# Patient Record
Sex: Female | Born: 2000 | Race: Black or African American | Hispanic: No | Marital: Single | Smoking: Current every day smoker
Health system: Southern US, Community
[De-identification: ages and names within clinical notes are randomized; demographics above are authoritative.]

---
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
## 2021-09-14 ENCOUNTER — Encounter: Payer: Self-pay | Admitting: Emergency Medicine

## 2021-09-14 ENCOUNTER — Emergency Department
Admission: EM | Admit: 2021-09-14 | Discharge: 2021-09-14 | Disposition: A | Discharge status: Home / Self Care | Payer: Medicaid Other | Attending: Emergency Medicine | Admitting: Emergency Medicine

## 2021-09-14 ENCOUNTER — Other Ambulatory Visit: Payer: Self-pay

## 2021-09-14 DIAGNOSIS — E860 Dehydration: Secondary | ICD-10-CM | POA: Diagnosis not present

## 2021-09-14 DIAGNOSIS — R112 Nausea with vomiting, unspecified: Secondary | ICD-10-CM

## 2021-09-14 DIAGNOSIS — R531 Weakness: Secondary | ICD-10-CM | POA: Diagnosis present

## 2021-09-14 DIAGNOSIS — Z20822 Contact with and (suspected) exposure to covid-19: Secondary | ICD-10-CM | POA: Insufficient documentation

## 2021-09-14 LAB — URINALYSIS, ROUTINE W REFLEX MICROSCOPIC
Bacteria, UA: NONE SEEN
Bilirubin Urine: NEGATIVE
Glucose, UA: NEGATIVE mg/dL
Hgb urine dipstick: NEGATIVE
Ketones, ur: NEGATIVE mg/dL
Nitrite: NEGATIVE
Protein, ur: NEGATIVE mg/dL
Specific Gravity, Urine: 1.019 (ref 1.005–1.030)
pH: 5 (ref 5.0–8.0)

## 2021-09-14 LAB — CBC
HCT: 41.2 % (ref 36.0–46.0)
Hemoglobin: 13.3 g/dL (ref 12.0–15.0)
MCH: 29.6 pg (ref 26.0–34.0)
MCHC: 32.3 g/dL (ref 30.0–36.0)
MCV: 91.6 fL (ref 80.0–100.0)
Platelets: 261 10*3/uL (ref 150–400)
RBC: 4.5 MIL/uL (ref 3.87–5.11)
RDW: 12.2 % (ref 11.5–15.5)
WBC: 5 10*3/uL (ref 4.0–10.5)
nRBC: 0 % (ref 0.0–0.2)

## 2021-09-14 LAB — BASIC METABOLIC PANEL
Anion gap: 5 (ref 5–15)
BUN: 9 mg/dL (ref 6–20)
CO2: 24 mmol/L (ref 22–32)
Calcium: 9 mg/dL (ref 8.9–10.3)
Chloride: 109 mmol/L (ref 98–111)
Creatinine, Ser: 0.77 mg/dL (ref 0.44–1.00)
GFR, Estimated: >60 mL/min (ref >60)
Glucose, Bld: 111 mg/dL — ABNORMAL HIGH (ref 70–99)
Potassium: 3.9 mmol/L (ref 3.5–5.1)
Sodium: 138 mmol/L (ref 135–145)

## 2021-09-14 LAB — POC URINE PREG, ED: Preg Test, Ur: NEGATIVE

## 2021-09-14 LAB — SARS CORONAVIRUS 2 BY RT PCR: SARS Coronavirus 2 by RT PCR: NEGATIVE

## 2021-09-14 MED ORDER — SODIUM CHLORIDE 0.9 % IV BOLUS
1000.0000 mL | Freq: Once | INTRAVENOUS | Status: AC
Start: 2021-09-14 — End: 2021-09-14
  Administered 2021-09-14: 1000 mL via INTRAVENOUS

## 2021-09-14 MED ORDER — METOCLOPRAMIDE HCL 5 MG/ML IJ SOLN
10.0000 mg | Freq: Once | INTRAMUSCULAR | Status: AC
  Administered 2021-09-14: 10 mg via INTRAVENOUS
  Filled 2021-09-14: qty 2

## 2021-09-14 MED ORDER — ONDANSETRON 8 MG PO TBDP: 8.0000 mg | ORAL_TABLET | Freq: Three times a day (TID) | ORAL | 0 refills | Status: AC | PRN

## 2021-09-14 NOTE — ED Triage Notes (Signed)
Patient to ED via POV ambulatory to triage for generalized weakness x1 week. Patient states she had one episode of vomiting this AM and has been feeling shaky with a headache since.

## 2021-09-14 NOTE — ED Provider Notes (Signed)
[facility] Provider Note   Event Date/Time   First MD Initiated Contact with Patient 09/14/21 [phone]     (approximate) History  Weakness  HPI Sandra Huerta is a 21 y.o. female  Location: Generalized Duration: 1 week prior to arrival Timing: Worsening since onset Severity: Moderate Quality: Weakness Context: Patient states that she has had worsening generalized weakness over the last week as well's nausea/vomiting beginning this morning Modifying factors: Any p.o. intake worsens her nausea vomiting she denies any relieving factors Associated Symptoms: Nausea/vomiting, headache ROS: Patient currently denies any vision changes, tinnitus, difficulty speaking, facial droop, sore throat, chest pain, shortness of breath, abdominal pain, diarrhea, dysuria, or weakness/numbness/paresthesias in any extremity   Physical Exam  Triage Vital Signs: ED Triage Vitals  Enc Vitals Group     BP 09/14/21 0754 (!) 133/92     Pulse Rate 09/14/21 0754 (!) 101     Resp 09/14/21 0754 18     Temp 09/14/21 0754 98.4 F (36.9 C)     Temp Source 09/14/21 0754 Oral     SpO2 09/14/21 0754 100 %     Weight 09/14/21 0755 110 lb (49.9 kg)     Height 09/14/21 0755 5\' 9"  (1.753 m)     Head Circumference --      Peak Flow --      Pain Score 09/14/21 0755 7     Pain Loc --      Pain Edu? --      Excl. in GC? --    Most recent vital signs: Vitals:   09/14/21 0754  BP: (!) 133/92  Pulse: (!) 101  Resp: 18  Temp: 98.4 F (36.9 C)  SpO2: 100%   General: Awake, oriented x4. CV:  Good peripheral perfusion.  Resp:  Normal effort.  Abd:  No distention.  Other:  Young adult African-American female laying in bed in no acute distress ED Results / Procedures / Treatments  Labs (all labs ordered are listed, but only abnormal results are displayed) Labs Reviewed  BASIC METABOLIC PANEL - Abnormal; Notable for the following components:      Result Value   Glucose, Bld 111 (*)     All other components within normal limits  URINALYSIS, ROUTINE W REFLEX MICROSCOPIC - Abnormal; Notable for the following components:   Color, Urine YELLOW (*)    APPearance CLOUDY (*)    Leukocytes,Ua LARGE (*)    All other components within normal limits  SARS CORONAVIRUS 2 BY RT PCR  CBC  POC URINE PREG, ED  CBG MONITORING, ED  PROCEDURES: Critical Care performed: No Procedures MEDICATIONS ORDERED IN ED: Medications  metoCLOPramide (REGLAN) injection 10 mg (10 mg Intravenous Given 09/14/21 0917)  sodium chloride 0.9 % bolus 1,000 mL (0 mLs Intravenous Stopped 09/14/21 1014)   IMPRESSION / MDM / ASSESSMENT AND PLAN / ED COURSE  I reviewed the triage vital signs and the nursing notes.                             The patient is on the cardiac monitor to evaluate for evidence of arrhythmia and/or significant heart rate changes. Patient's presentation is most consistent with acute presentation with potential threat to life or bodily function. Patient presents for acute nausea/vomiting The cause of the patients symptoms is not clear, but the patient is overall well appearing and is suspected to have a transient course of illness.  Given  History and Exam there does not appear to be an emergent cause of the symptoms such as small bowel obstruction, coronary syndrome, bowel ischemia, DKA, pancreatitis, appendicitis, other acute abdomen or other emergent problem.  Reassessment: After treatment, the patient is feeling much better, tolerating PO fluids, and shows no signs of dehydration.   Disposition: Discharge home with prompt primary care physician follow up in the next 48 hours. Strict return precautions discussed.   FINAL CLINICAL IMPRESSION(S) / ED DIAGNOSES   Final diagnoses:  Dehydration  Nausea and vomiting, unspecified vomiting type  Generalized weakness   Rx / DC Orders   ED Discharge Orders          Ordered    ondansetron (ZOFRAN-ODT) 8 MG disintegrating tablet  Every 8  hours PRN        09/14/21 1008           Note:  This document was prepared using Dragon voice recognition software and may include unintentional dictation errors.   Merwyn Katos, MD 09/14/21 1015

## 2021-09-14 NOTE — ED Notes (Signed)
See triage note  Presents with headache  Not feeling herself  States she also has some vomiting   unsure of fever but has "felt hot"

## 2021-09-24 ENCOUNTER — Emergency Department
Admission: EM | Admit: 2021-09-24 | Discharge: 2021-09-24 | Disposition: A | Discharge status: Home / Self Care | Payer: Medicaid Other | Attending: Emergency Medicine | Admitting: Emergency Medicine

## 2021-09-24 ENCOUNTER — Other Ambulatory Visit: Payer: Self-pay

## 2021-09-24 DIAGNOSIS — R293 Abnormal posture: Secondary | ICD-10-CM

## 2021-09-24 DIAGNOSIS — J069 Acute upper respiratory infection, unspecified: Secondary | ICD-10-CM | POA: Insufficient documentation

## 2021-09-24 DIAGNOSIS — L739 Follicular disorder, unspecified: Secondary | ICD-10-CM | POA: Diagnosis not present

## 2021-09-24 DIAGNOSIS — R059 Cough, unspecified: Secondary | ICD-10-CM | POA: Diagnosis present

## 2021-09-24 MED ORDER — AMOXICILLIN 500 MG PO CAPS: 500.0000 mg | ORAL_CAPSULE | Freq: Three times a day (TID) | ORAL | 0 refills | Status: DC

## 2021-09-24 MED ORDER — BENZONATATE 100 MG PO CAPS: 100.0000 mg | ORAL_CAPSULE | Freq: Three times a day (TID) | ORAL | 0 refills | Status: AC | PRN

## 2021-09-24 NOTE — ED Triage Notes (Signed)
Pt c/o cough with congestion, body aches and HA since Tuesday.Marland Kitchen

## 2021-09-24 NOTE — Discharge Instructions (Addendum)
Follow up with your regular doctor Follow up with dermatology Follow up with orthopedics The ER cannot provide family medicine, it is important that you have a regular doctor and follow up with them

## 2021-09-24 NOTE — ED Notes (Signed)
See triage note  Presents with runny nose   cough and body aches  States she was seen for same last week  States sx's became worse on Tuesday  unsure of fever  states she has been fatigued

## 2021-09-24 NOTE — ED Provider Notes (Signed)
[facility] Provider Note    Event Date/Time   First MD Initiated Contact with Patient 09/24/21 0720     (approximate)   History   URI   HPI  Sandra Huerta is a 21 y.o. female presents emergency department multiple complaints.  Patient states she has had a runny nose, cough and body aches for few days.  Came last week and had a negative COVID test.  No known fever.  Also has had a headache.  Concerned about her posture and would like to see someone about that.  Also is concerned because she keeps getting these Bruun bumps on her pubis that she pops and it goes away.      Physical Exam   Triage Vital Signs: ED Triage Vitals  Enc Vitals Group     BP 09/24/21 0718 132/88     Pulse Rate 09/24/21 0718 98     Resp 09/24/21 0718 20     Temp 09/24/21 0718 98.4 F (36.9 C)     Temp Source 09/24/21 0718 Oral     SpO2 09/24/21 0718 100 %     Weight 09/24/21 0717 109 lb 12.6 oz (49.8 kg)     Height 09/24/21 0717 5\' 9"  (1.753 m)     Head Circumference --      Peak Flow --      Pain Score --      Pain Loc --      Pain Edu? --      Excl. in GC? --     Most recent vital signs: Vitals:   09/24/21 0718  BP: 132/88  Pulse: 98  Resp: 20  Temp: 98.4 F (36.9 C)  SpO2: 100%     General: Awake, no distress.   CV:  Good peripheral perfusion. regular rate and  rhythm Resp:  Normal effort. Lungs CTA Abd:  No distention.   Other:      ED Results / Procedures / Treatments   Labs (all labs ordered are listed, but only abnormal results are displayed) Labs Reviewed - No data to display   EKG     RADIOLOGY     PROCEDURES:   Procedures   MEDICATIONS ORDERED IN ED: Medications - No data to display   IMPRESSION / MDM / ASSESSMENT AND PLAN / ED COURSE  I reviewed the triage vital signs and the nursing notes.                              Differential diagnosis includes, but is not limited to, COVID, URI, common cold,  folliculitis  Patient's presentation is most consistent with acute, uncomplicated illness.   Most of patient's complaints should be followed up with a family physician.  I did have a long discussion with her about this.  Explained to her the emergency department cannot provide family medicine which would include her following up with someone for her posture and folliculitis.  Due to the upper respiratory infection we can treat that with an antibiotic and some cough medicine.  She was discharged stable condition.      FINAL CLINICAL IMPRESSION(S) / ED DIAGNOSES   Final diagnoses:  Acute URI  Folliculitis  Bad posture     Rx / DC Orders   ED Discharge Orders          Ordered    amoxicillin (AMOXIL) 500 MG capsule  3 times daily,   Status:  Discontinued        09/24/21 0746    benzonatate (TESSALON PERLES) 100 MG capsule  3 times daily PRN        09/24/21 0746    amoxicillin (AMOXIL) 500 MG capsule  3 times daily        09/24/21 4656             Note:  This document was prepared using Dragon voice recognition software and may include unintentional dictation errors.    Faythe Ghee, PA-C 09/24/21 8127    Arnaldo Natal, MD 09/24/21 [phone]

## 2021-11-02 ENCOUNTER — Emergency Department: Payer: Medicaid Other

## 2021-11-02 ENCOUNTER — Emergency Department
Admission: EM | Admit: 2021-11-02 | Discharge: 2021-11-02 | Disposition: A | Discharge status: Home / Self Care | Payer: Medicaid Other | Attending: Emergency Medicine | Admitting: Emergency Medicine

## 2021-11-02 ENCOUNTER — Other Ambulatory Visit: Payer: Self-pay

## 2021-11-02 DIAGNOSIS — W540XXA Bitten by dog, initial encounter: Secondary | ICD-10-CM | POA: Diagnosis not present

## 2021-11-02 DIAGNOSIS — S51831A Puncture wound without foreign body of right forearm, initial encounter: Secondary | ICD-10-CM | POA: Insufficient documentation

## 2021-11-02 DIAGNOSIS — S51851A Open bite of right forearm, initial encounter: Secondary | ICD-10-CM

## 2021-11-02 DIAGNOSIS — T148XXA Other injury of unspecified body region, initial encounter: Secondary | ICD-10-CM

## 2021-11-02 DIAGNOSIS — S59911A Unspecified injury of right forearm, initial encounter: Secondary | ICD-10-CM | POA: Diagnosis present

## 2021-11-02 MED ORDER — AMOXICILLIN-POT CLAVULANATE 875-125 MG PO TABS: 1.0000 | ORAL_TABLET | Freq: Two times a day (BID) | ORAL | 0 refills | Status: AC

## 2021-11-02 MED ORDER — TETANUS-DIPHTH-ACELL PERTUSSIS 5-2.5-18.5 LF-MCG/0.5 IM SUSY
0.5000 mL | PREFILLED_SYRINGE | Freq: Once | INTRAMUSCULAR | Status: DC
  Filled 2021-11-02: qty 0.5

## 2021-11-02 NOTE — Discharge Instructions (Addendum)
Please use ibuprofen (Motrin) up to 800 mg every 8 hours, naproxen (Naprosyn) up to 500 mg every 12 hours, and/or acetaminophen (Tylenol) up to 4 g/day for any continued pain  Please use ice and elevation of the wound above the level of your heart is much as possible

## 2021-11-02 NOTE — ED Notes (Signed)
Discharge instructions reviewed with patient. Patient questions answered and opportunity for education reviewed. Patient voices understanding of discharge instructions with no further questions. Patient ambulatory with steady gait to lobby.  

## 2021-11-02 NOTE — ED Triage Notes (Signed)
Pt was bit by dog on right wrist. Police and animal control were on seen. Pt states the dog is up to date on his shots.

## 2021-11-02 NOTE — ED Provider Notes (Signed)
[facility] Provider Note   Event Date/Time   First MD Initiated Contact with Patient 11/02/21 1636     (approximate) History  Animal Bite  HPI Sandra Huerta is a 21 y.o. female with no stated past medical history who presents for a dog bite to the right forearm that occurred 24 hours prior to arrival.  Patient states that she has 1 puncture wound on the under side of her forearm that was bleeding overnight however it has been hemostatic since this morning.  Patient states that this was a known family dog that was up-to-date on vaccinations however she does not know when her last tetanus shot was. ROS: Patient currently denies any vision changes, tinnitus, difficulty speaking, facial droop, sore throat, chest pain, shortness of breath, abdominal pain, nausea/vomiting/diarrhea, dysuria, or weakness/numbness/paresthesias in any extremity   Physical Exam  Triage Vital Signs: ED Triage Vitals  Enc Vitals Group     BP 11/02/21 1511 (!) 131/91     Pulse Rate 11/02/21 1511 86     Resp 11/02/21 1511 17     Temp 11/02/21 1511 98.2 F (36.8 C)     Temp Source 11/02/21 1511 Oral     SpO2 11/02/21 1511 100 %     Weight 11/02/21 1512 109 lb (49.4 kg)     Height 11/02/21 1512 5\' 9"  (1.753 m)     Head Circumference --      Peak Flow --      Pain Score 11/02/21 1512 8     Pain Loc --      Pain Edu? --      Excl. in GC? --    Most recent vital signs: Vitals:   11/02/21 1511 11/02/21 1715  BP: (!) 131/91 129/88  Pulse: 86 90  Resp: 17 16  Temp: 98.2 F (36.8 C) 98.3 F (36.8 C)  SpO2: 100% 99%   General: Awake, oriented x4. CV:  Good peripheral perfusion.  Resp:  Normal effort.  Abd:  No distention.  Other:  Young adult African-American female sitting on stretcher in no acute distress with multiple superficial abrasions over the dorsum of the mid forearm as well as 1 puncture wound to the underside of the mid forearm that is hemostatic without any significant  surrounding erythema or purulent drainage ED Results / Procedures / Treatments   RADIOLOGY ED MD interpretation: X-ray of the right forearm interpreted by me and shows mild distal soft tissue swelling without any evidence of fracture or radiopaque foreign bodies -Agree with radiology assessment Official radiology report(s): DG Forearm Right  Result Date: 11/02/2021 CLINICAL DATA:  Dog bite distal right forearm, pain EXAM: RIGHT FOREARM - 2 VIEW COMPARISON:  None Available. FINDINGS: Frontal and lateral views of the right forearm are obtained. There are no acute fractures or radiopaque foreign bodies. Alignment is anatomic. Mild soft tissue swelling distal right forearm. IMPRESSION: 1. Mild distal soft tissue swelling. No fracture or radiopaque foreign body. Electronically Signed   By: 11/04/2021 M.D.   On: 11/02/2021 15:31   PROCEDURES: Critical Care performed: No Procedures MEDICATIONS ORDERED IN ED: Medications  Tdap (BOOSTRIX) injection 0.5 mL (0.5 mLs Intramuscular Patient Refused/Not Given 11/02/21 1715)   IMPRESSION / MDM / ASSESSMENT AND PLAN / ED COURSE  I reviewed the triage vital signs and the nursing notes.  The patient is on the cardiac monitor to evaluate for evidence of arrhythmia and/or significant heart rate changes. Patient's presentation is most consistent with acute presentation with potential threat to life or bodily function. Patient had a dog bite that was cleaned in the ED with copious irrigation.  Please see laceration procedure note for further details.  After exploration of the wound, there was no evidence of a retained foreign body. No evidence of underlying fracture. TDAP: UTD Interventions: Augmentin given dog bite Disposition: Discharge. Patient has been given strict wound return precautions and instructions to follow up with their PMD in 2 days for a wound recheck.   FINAL CLINICAL IMPRESSION(S) / ED DIAGNOSES   Final  diagnoses:  Dog bite of right forearm without complication, initial encounter  Bone bruise   Rx / DC Orders   ED Discharge Orders          Ordered    amoxicillin-clavulanate (AUGMENTIN) 875-125 MG tablet  2 times daily        11/02/21 1710           Note:  This document was prepared using Dragon voice recognition software and may include unintentional dictation errors.   Merwyn Katos, MD 11/02/21 2139

## 2021-11-02 NOTE — ED Provider Triage Note (Signed)
Emergency Medicine Provider Triage Evaluation Note  Sandra Huerta , a 21 y.o. female  was evaluated in triage.  Pt complains of dog bite to the right forearm happened yesterday.  Dog's immunizations are up-to-date.  Patient unsure of her last Tdap.  Review of Systems  Positive: Dog bite Negative:   Physical Exam  BP (!) 131/91 (BP Location: Right Arm)   Pulse 86   Temp 98.2 F (36.8 C) (Oral)   Resp 17   Ht 5\' 9"  (1.753 m)   Wt 49.4 kg   LMP 10/23/2021   SpO2 100%   BMI 16.10 kg/m  Gen:   Awake, no distress   Resp:  Normal effort  MSK:   Moves extremities without difficulty, dog bite noted to the right forearm Other:    Medical Decision Making  Medically screening exam initiated at 3:15 PM.  Appropriate orders placed.  Karess N Gates was informed that the remainder of the evaluation will be completed by another provider, this initial triage assessment does not replace that evaluation, and the importance of remaining in the ED until their evaluation is complete.  X-ray Tdap ordered   10/25/2021, PA-C 11/02/21 1516

## 2021-11-24 ENCOUNTER — Other Ambulatory Visit: Payer: Self-pay | Admitting: Family Medicine

## 2021-11-24 DIAGNOSIS — J029 Acute pharyngitis, unspecified: Secondary | ICD-10-CM

## 2021-11-24 DIAGNOSIS — E049 Nontoxic goiter, unspecified: Secondary | ICD-10-CM
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
## 2021-12-10 ENCOUNTER — Encounter (HOSPITAL_COMMUNITY): Payer: Self-pay | Admitting: Emergency Medicine

## 2021-12-10 ENCOUNTER — Ambulatory Visit (HOSPITAL_COMMUNITY)
Admission: EM | Admit: 2021-12-10 | Discharge: 2021-12-10 | Disposition: A | Discharge status: Home / Self Care | Payer: Medicaid Other | Attending: Physician Assistant | Admitting: Physician Assistant

## 2021-12-10 DIAGNOSIS — N898 Other specified noninflammatory disorders of vagina: Secondary | ICD-10-CM | POA: Diagnosis present

## 2021-12-10 LAB — POC URINE PREG, ED: Preg Test, Ur: NEGATIVE

## 2021-12-10 MED ORDER — FLUCONAZOLE 150 MG PO TABS: ORAL_TABLET | ORAL | 0 refills | Status: AC

## 2021-12-10 NOTE — ED Triage Notes (Signed)
Pt reports white vaginal discharge and irritation x 1 week.

## 2021-12-10 NOTE — ED Provider Notes (Signed)
[place]    CSN: [id number] Arrival date & time: 12/10/21  3151      History   Chief Complaint Chief Complaint  Patient presents with   Vaginal Discharge    HPI Sandra Huerta is a 21 y.o. female.   Patient here today for evaluation of white vaginal discharge with associated vaginal itching. She reports symptoms have been ongoing for one week. She does have history of yeast infections and feels her symptoms are similar. She has not had any rash although notes she may be developing one from itching. She has not had any abd pain, nausea, vomiting. She does not report treatment for symptoms. She denies any known STD exposures. She "hopes" she is not pregnant.   The history is provided by the patient.  Vaginal Discharge Associated symptoms: no abdominal pain, no fever, no nausea and no vomiting     History reviewed. No pertinent past medical history.  There are no problems to display for this patient.   History reviewed. No pertinent surgical history.  OB History     Gravida  1   Para      Term      Preterm      AB      Living         SAB      IAB      Ectopic      Multiple      Live Births               Home Medications    Prior to Admission medications   Medication Sig Start Date End Date Taking? Authorizing Provider  fluconazole (DIFLUCAN) 150 MG tablet Take one tab PO today, repeat dose in 3 days if still symptomatic 12/10/21  Yes Francene Finders, PA-C  benzonatate (TESSALON PERLES) 100 MG capsule Take 1 capsule (100 mg total) by mouth 3 (three) times daily as needed for cough. 09/24/21 09/24/22  Fisher, Linden Dolin, PA-C  ipratropium (ATROVENT) 0.06 % nasal spray Place 2 sprays into both nostrils 4 (four) times daily. 09/01/20   Margarette Canada, NP  ondansetron (ZOFRAN-ODT) 8 MG disintegrating tablet Take 1 tablet (8 mg total) by mouth every 8 (eight) hours as needed for nausea or vomiting. 09/14/21   Naaman Plummer, MD    Family  History History reviewed. No pertinent family history.  Social History Social History   Tobacco Use   Smoking status: Every Day    Types: E-cigarettes   Smokeless tobacco: Never  Vaping Use   Vaping Use: Never used  Substance Use Topics   Alcohol use: Yes    Comment: last night   Drug use: Not Currently    Types: Marijuana     Allergies   Patient has no known allergies.   Review of Systems Review of Systems  Constitutional:  Negative for chills and fever.  Eyes:  Negative for discharge and redness.  Gastrointestinal:  Negative for abdominal pain, nausea and vomiting.  Genitourinary:  Positive for vaginal discharge. Negative for genital sores.  Musculoskeletal:  Negative for back pain.     Physical Exam Triage Vital Signs ED Triage Vitals  Enc Vitals Group     BP 12/10/21 0829 127/86     Pulse Rate 12/10/21 0829 84     Resp 12/10/21 0829 16     Temp 12/10/21 0829 98.8 F (37.1 C)     Temp Source 12/10/21 0829 Oral     SpO2 12/10/21 0829  99 %     Weight --      Height --      Head Circumference --      Peak Flow --      Pain Score 12/10/21 0828 0     Pain Loc --      Pain Edu? --      Excl. in GC? --    No data found.  Updated Vital Signs BP 127/86 (BP Location: Right Arm)   Pulse 84   Temp 98.8 F (37.1 C) (Oral)   Resp 16   LMP 11/29/2021   SpO2 99%      Physical Exam Vitals and nursing note reviewed.  Constitutional:      General: She is not in acute distress.    Appearance: Normal appearance. She is not ill-appearing.  HENT:     Head: Normocephalic and atraumatic.  Eyes:     Conjunctiva/sclera: Conjunctivae normal.  Cardiovascular:     Rate and Rhythm: Normal rate.  Pulmonary:     Effort: Pulmonary effort is normal.  Neurological:     Mental Status: She is alert.  Psychiatric:        Mood and Affect: Mood normal.        Behavior: Behavior normal.        Thought Content: Thought content normal.      UC Treatments / Results   Labs (all labs ordered are listed, but only abnormal results are displayed) Labs Reviewed  POC URINE PREG, ED  CERVICOVAGINAL ANCILLARY ONLY    EKG   Radiology No results found.  Procedures Procedures (including critical care time)  Medications Ordered in UC Medications - No data to display  Initial Impression / Assessment and Plan / UC Course  I have reviewed the triage vital signs and the nursing notes.  Pertinent labs & imaging results that were available during my care of the patient were reviewed by me and considered in my medical decision making (see chart for details).    Urine pregnancy test negative. Will treat with diflucan and BV, yeast, STD screening ordered. Will await results for further recommendation. Encouraged sooner follow up with any concerns.   Final Clinical Impressions(s) / UC Diagnoses   Final diagnoses:  Vaginal discharge  Vaginal itching     Discharge Instructions       Pregnancy test was NEGATIVE.   Medication sent to pharmacy as discussed.   Check MyChart for results of screening. We will call with any positive results.   Follow up with any further concerns.        ED Prescriptions     Medication Sig Dispense Auth. Provider   fluconazole (DIFLUCAN) 150 MG tablet Take one tab PO today, repeat dose in 3 days if still symptomatic 2 tablet Tomi Bamberger, PA-C      PDMP not reviewed this encounter.   Tomi Bamberger, PA-C 12/10/21 [phone]

## 2021-12-10 NOTE — Discharge Instructions (Signed)
  Pregnancy test was NEGATIVE.   Medication sent to pharmacy as discussed.   Check MyChart for results of screening. We will call with any positive results.   Follow up with any further concerns.

## 2021-12-13 LAB — CERVICOVAGINAL ANCILLARY ONLY
Bacterial Vaginitis (gardnerella): POSITIVE — AB
Candida Glabrata: NEGATIVE
Candida Vaginitis: POSITIVE — AB
Chlamydia: NEGATIVE
Comment: NEGATIVE
Comment: NEGATIVE
Comment: NEGATIVE
Comment: NEGATIVE
Comment: NEGATIVE
Comment: NORMAL
Neisseria Gonorrhea: NEGATIVE
Trichomonas: NEGATIVE

## 2021-12-14 ENCOUNTER — Telehealth (HOSPITAL_COMMUNITY): Payer: Self-pay | Admitting: Emergency Medicine

## 2021-12-14 MED ORDER — METRONIDAZOLE 500 MG PO TABS: 500.0000 mg | ORAL_TABLET | Freq: Two times a day (BID) | ORAL | 0 refills | Status: AC

## 2022-02-12 ENCOUNTER — Ambulatory Visit (HOSPITAL_COMMUNITY)
Admission: EM | Admit: 2022-02-12 | Discharge: 2022-02-12 | Disposition: A | Discharge status: Home / Self Care | Payer: Medicaid Other | Attending: Physician Assistant | Admitting: Physician Assistant

## 2022-02-12 ENCOUNTER — Encounter (HOSPITAL_COMMUNITY): Payer: Self-pay | Admitting: Emergency Medicine

## 2022-02-12 DIAGNOSIS — R197 Diarrhea, unspecified: Secondary | ICD-10-CM

## 2022-02-12 MED ORDER — DIPHENOXYLATE-ATROPINE 2.5-0.025 MG PO TABS: 1.0000 | ORAL_TABLET | Freq: Three times a day (TID) | ORAL | 0 refills | Status: AC

## 2022-02-12 NOTE — ED Provider Notes (Signed)
[facility]    CSN: [id number] Arrival date & time: 02/12/22  1028      History   Chief Complaint Chief Complaint  Patient presents with   Diarrhea    HPI Sandra Huerta is a 21 y.o. female.   21 year old female presents with loose bowels.  Patient indicates for the past week she has been having frequent bowel movements that have been intermittent from loose, watery and frequent.  She relates that she has having some stomach cramping associated with the bowel movements but when she is not having an active bowel movement she does not have any stomach cramping.  Patient indicates that she is not having fever, chills, nausea or vomiting.  Patient indicates when she does have a bowel movement it is sometimes watery and soft like blood.  Patient indicates she is not having blood in the bowels but she does indicate that sometimes changes colors dark brown to light brown.  Patient indicates that she has not foods, and she has not traveled outside of the state.  Patient states she has not been around any family or friends with similar type symptoms.   Diarrhea   History reviewed. No pertinent past medical history.  There are no problems to display for this patient.   History reviewed. No pertinent surgical history.  OB History     Gravida  1   Para      Term      Preterm      AB      Living         SAB      IAB      Ectopic      Multiple      Live Births               Home Medications    Prior to Admission medications   Medication Sig Start Date End Date Taking? Authorizing Provider  diphenoxylate-atropine (LOMOTIL) 2.5-0.025 MG tablet Take 1 tablet by mouth 3 (three) times daily. 02/12/22  Yes Ellsworth Lennox, PA-C  benzonatate (TESSALON PERLES) 100 MG capsule Take 1 capsule (100 mg total) by mouth 3 (three) times daily as needed for cough. 09/24/21 09/24/22  Fisher, Roselyn Bering, PA-C  fluconazole (DIFLUCAN) 150 MG tablet Take one tab PO today, repeat  dose in 3 days if still symptomatic 12/10/21   Tomi Bamberger, PA-C  ipratropium (ATROVENT) 0.06 % nasal spray Place 2 sprays into both nostrils 4 (four) times daily. 09/01/20   Becky Augusta, NP  metroNIDAZOLE (FLAGYL) 500 MG tablet Take 1 tablet (500 mg total) by mouth 2 (two) times daily. 12/14/21   Merrilee Jansky, MD  ondansetron (ZOFRAN-ODT) 8 MG disintegrating tablet Take 1 tablet (8 mg total) by mouth every 8 (eight) hours as needed for nausea or vomiting. 09/14/21   Merwyn Katos, MD    Family History No family history on file.  Social History Social History   Tobacco Use   Smoking status: Every Day    Types: E-cigarettes   Smokeless tobacco: Never  Vaping Use   Vaping Use: Never used  Substance Use Topics   Alcohol use: Yes    Comment: last night   Drug use: Not Currently    Types: Marijuana     Allergies   Patient has no known allergies.   Review of Systems Review of Systems  Gastrointestinal:  Positive for diarrhea.     Physical Exam Triage Vital Signs ED Triage Vitals  Enc  Vitals Group     BP 02/12/22 1227 131/88     Pulse Rate 02/12/22 1227 78     Resp 02/12/22 1227 16     Temp 02/12/22 1227 98.8 F (37.1 C)     Temp Source 02/12/22 1227 Oral     SpO2 02/12/22 1227 97 %     Weight --      Height --      Head Circumference --      Peak Flow --      Pain Score 02/12/22 1224 10     Pain Loc --      Pain Edu? --      Excl. in GC? --    No data found.  Updated Vital Signs BP 131/88 (BP Location: Left Arm)   Pulse 78   Temp 98.8 F (37.1 C) (Oral)   Resp 16   LMP 02/12/2022   SpO2 97%   Visual Acuity Right Eye Distance:   Left Eye Distance:   Bilateral Distance:    Right Eye Near:   Left Eye Near:    Bilateral Near:     Physical Exam Constitutional:      Appearance: Normal appearance.  Abdominal:     General: Abdomen is flat. Bowel sounds are normal.     Palpations: Abdomen is soft.     Tenderness: There is no abdominal  tenderness. There is no guarding or rebound.  Neurological:     Mental Status: She is alert.      UC Treatments / Results  Labs (all labs ordered are listed, but only abnormal results are displayed) Labs Reviewed - No data to display  EKG   Radiology No results found.  Procedures Procedures (including critical care time)  Medications Ordered in UC Medications - No data to display  Initial Impression / Assessment and Plan / UC Course  I have reviewed the triage vital signs and the nursing notes.  Pertinent labs & imaging results that were available during my care of the patient were reviewed by me and considered in my medical decision making (see chart for details).    Plan: 1.  The diarrhea will be treated with the following: A.  Lomotil 2.5 mg 3 times a day to help stool is formed. B.  Patient has been advised to stop the Lomotil as soon as the bowel movements become formed. 2.  Patient advised to follow-up PCP or return to urgent Final Clinical Impressions(s) / UC Diagnoses   Final diagnoses:  Diarrhea, unspecified type     Discharge Instructions      Advised to take the Lomotil 3 times a day until loose stools become more formed and normal.  As soon as they become normal stop the medication. Advised to increase fluid intake to prevent dehydration. Advised to follow-up with PCP or return to urgent care if symptoms fail to    ED Prescriptions     Medication Sig Dispense Auth. Provider   diphenoxylate-atropine (LOMOTIL) 2.5-0.025 MG tablet Take 1 tablet by mouth 3 (three) times daily. 30 tablet Ellsworth Lennox, PA-C      I have reviewed the PDMP during this encounter.   Ellsworth Lennox, PA-C 02/12/22 1308

## 2022-02-12 NOTE — Discharge Instructions (Signed)
Advised to take the Lomotil 3 times a day until loose stools become more formed and normal.  As soon as they become normal stop the medication. Advised to increase fluid intake to prevent dehydration. Advised to follow-up with PCP or return to urgent care if symptoms fail to

## 2022-02-12 NOTE — ED Triage Notes (Signed)
Pt reports having muddy stool that have been frequent since last night.  Pt reports that rectum sore when has BM. Reports color of BM changes and this morning reddish.

## 2022-04-23 ENCOUNTER — Emergency Department
Admission: EM | Admit: 2022-04-23 | Discharge: 2022-04-23 | Disposition: A | Discharge status: Home / Self Care | Payer: Medicaid Other | Attending: Emergency Medicine | Admitting: Emergency Medicine

## 2022-04-23 ENCOUNTER — Other Ambulatory Visit: Payer: Self-pay

## 2022-04-23 DIAGNOSIS — N898 Other specified noninflammatory disorders of vagina: Secondary | ICD-10-CM | POA: Diagnosis present

## 2022-04-23 LAB — URINALYSIS, ROUTINE W REFLEX MICROSCOPIC
Bilirubin Urine: NEGATIVE
Glucose, UA: NEGATIVE mg/dL
Hgb urine dipstick: NEGATIVE
Ketones, ur: NEGATIVE mg/dL
Leukocytes,Ua: NEGATIVE
Nitrite: NEGATIVE
Protein, ur: NEGATIVE mg/dL
Specific Gravity, Urine: 1.019 (ref 1.005–1.030)
pH: 6 (ref 5.0–8.0)

## 2022-04-23 LAB — WET PREP, GENITAL
Clue Cells Wet Prep HPF POC: NONE SEEN
Sperm: NONE SEEN
Trich, Wet Prep: NONE SEEN
WBC, Wet Prep HPF POC: >=10 — AB (ref <10)
Yeast Wet Prep HPF POC: NONE SEEN

## 2022-04-23 LAB — CHLAMYDIA/NGC RT PCR (ARMC ONLY)
Chlamydia Tr: NOT DETECTED
N gonorrhoeae: NOT DETECTED

## 2022-04-23 LAB — POC URINE PREG, ED: Preg Test, Ur: NEGATIVE

## 2022-04-23 NOTE — ED Triage Notes (Signed)
Pt states she wants to be checked for herpes. Pt states was tested last week and they said all was fine but she continues to itch and they didn't check for that.

## 2022-04-23 NOTE — ED Notes (Signed)
Patient resting, taking on phone. Gyn cart in hallway outside room with all needed swabs. Patient was given wipes and specimen container with instructions for clean catch urine.

## 2022-04-23 NOTE — ED Provider Notes (Signed)
[facility] Provider Note    Event Date/Time   First MD Initiated Contact with Patient 04/23/22 1044     (approximate)   History   Vaginal Itching   HPI  Sandra Huerta is a 22 y.o. female who presents today with request for herpes swab.  Patient reports that she has had mild vaginal itching for the past several weeks.  She reports that she has been tested for "everything except herpes."  She is requesting a herpes test today.  She has noticed a small lesion that is not painful, and she thinks it is an ingrown hair.  No burning when she pees.  No nausea or vomiting.  No fevers or chills.  There are no problems to display for this patient.         Physical Exam   Triage Vital Signs: ED Triage Vitals  Enc Vitals Group     BP 04/23/22 1022 (!) 129/90     Pulse Rate 04/23/22 1022 88     Resp 04/23/22 1022 20     Temp 04/23/22 1022 98.4 F (36.9 C)     Temp Source 04/23/22 1022 Oral     SpO2 04/23/22 1022 97 %     Weight 04/23/22 1021 108 lb 0.4 oz (49 kg)     Height 04/23/22 1021 5' 9"$  (1.753 m)     Head Circumference --      Peak Flow --      Pain Score 04/23/22 1021 5     Pain Loc --      Pain Edu? --      Excl. in [place]? --     Most recent vital signs: Vitals:   04/23/22 1022  BP: (!) 129/90  Pulse: 88  Resp: 20  Temp: 98.4 F (36.9 C)  SpO2: 97%    Physical Exam Vitals and nursing note reviewed.  Constitutional:      General: Awake and alert. No acute distress.    Appearance: Normal appearance. The patient is normal weight.  HENT:     Head: Normocephalic and atraumatic.     Mouth: Mucous membranes are moist.  Eyes:     General: PERRL. Normal EOMs        Right eye: No discharge.        Left eye: No discharge.     Conjunctiva/sclera: Conjunctivae normal.  Cardiovascular:     Rate and Rhythm: Normal rate and regular rhythm.     Pulses: Normal pulses.  Pulmonary:     Effort: Pulmonary effort is normal. No respiratory  distress.     Breath sounds: Normal breath sounds.  Abdominal:     Abdomen is soft. There is no abdominal tenderness. No rebound or guarding. No distention. Pelvic exam: Small ingrown hair noted to her left labia, nontender to palpation.  Normal external genitalia otherwise.  Scant white/gray discharge in vaginal vault.  No bleeding.  No adnexal tenderness or fullness.  No cervical motion tenderness. Musculoskeletal:        General: No swelling. Normal range of motion.     Cervical back: Normal range of motion and neck supple.  Skin:    General: Skin is warm and dry.     Capillary Refill: Capillary refill takes less than 2 seconds.     Findings: No rash.  Neurological:     Mental Status: The patient is awake and alert.      ED Results / Procedures / Treatments   Labs (  all labs ordered are listed, but only abnormal results are displayed) Labs Reviewed  WET PREP, GENITAL - Abnormal; Notable for the following components:      Result Value   WBC, Wet Prep HPF POC >=10 (*)    All other components within normal limits  URINALYSIS, ROUTINE W REFLEX MICROSCOPIC - Abnormal; Notable for the following components:   Color, Urine YELLOW (*)    APPearance CLEAR (*)    All other components within normal limits  CHLAMYDIA/NGC RT PCR ([facility] ONLY)            HSV CULTURE AND TYPING  POC URINE PREG, ED     EKG     RADIOLOGY     PROCEDURES:  Critical Care performed:   Procedures   MEDICATIONS ORDERED IN ED: Medications - No data to display   IMPRESSION / MDM / [place] / ED COURSE  I reviewed the triage vital signs and the nursing notes.   Differential diagnosis includes, but is not limited to, STD, bacterial vaginal, candidiasis, trichomonas.  Patient is awake and alert, hemodynamically stable and afebrile.  She is in no acute distress.  She agreed to pelvic and swabs.  I do not see anything that looks suspicious for herpes.  There is 1 area that appears to be an  ingrown hair, this was swabbed per her request, though no pain or constitutional symptoms to suggest first time HSV.  I spoke with lab, and a swab takes 2-5 business days to return.  Discussed with patient, will not initiate empiric treatment given my low suspicion for herpes at this time.  Patient is in agreement with this plan.  Wet prep is positive for white blood cells, though no clue cells or trichomonas or candidiasis.  She declined testing for HIV, syphilis, or hepatitis.  Gonorrhea and chlamydia also negative.  Patient was advised that there are many other STDs that patient could have, that we do not test for in the emergency department. Patient was advised to follow up with outpatient provider to have the full panel of testing performed. Patient was advised to not have sexual intercourse until fully and properly tested and treated. Patient was advised that his partner also needs to be tested and treated. Patient understands and agrees with plan.   Patient's presentation is most consistent with acute complicated illness / injury requiring diagnostic workup.    FINAL CLINICAL IMPRESSION(S) / ED DIAGNOSES   Final diagnoses:  Vaginal discharge     Rx / DC Orders   ED Discharge Orders     None        Note:  This document was prepared using Dragon voice recognition software and may include unintentional dictation errors.   Marquette Old, PA-C 04/23/22 1424    Nathaniel Man, MD 04/24/22 [phone]

## 2022-04-23 NOTE — Discharge Instructions (Signed)
Your swabs are negative for BV, trichomonas, gonorrhea, chlamydia.  You did not wish to be tested for HIV, hepatitis, or syphilis at this time.  Your HSV swab is not yet back, but you may find the results on MyChart.  Please follow-up with OB/GYN, call the phone number provided.  Please return for any new, worsening, or change in symptoms or other concerns.  It was a pleasure caring for you today.

## 2022-04-23 NOTE — ED Notes (Signed)
Patient declined discharge vital signs. 

## 2022-04-26 LAB — HSV CULTURE AND TYPING

## 2023-12-27 ENCOUNTER — Ambulatory Visit
